# Patient Record
Sex: Female | Born: 1940 | Race: Black or African American | Hispanic: No
Health system: Southern US, Community
[De-identification: ages and names within clinical notes are randomized; demographics above are authoritative.]

---

## 2014-01-20 DIAGNOSIS — E785 Hyperlipidemia, unspecified: Secondary | ICD-10-CM | POA: Insufficient documentation

## 2014-01-26 DIAGNOSIS — J449 Chronic obstructive pulmonary disease, unspecified: Secondary | ICD-10-CM | POA: Insufficient documentation

## 2019-07-22 ENCOUNTER — Emergency Department (HOSPITAL_COMMUNITY): Payer: Medicare (Managed Care)

## 2019-07-22 ENCOUNTER — Observation Stay (HOSPITAL_COMMUNITY)
Admission: EM | Admit: 2019-07-22 | Discharge: 2019-07-23 | Disposition: A | Discharge status: Home / Self Care | Payer: Medicare (Managed Care) | Attending: Student in an Organized Health Care Education/Training Program | Admitting: Student in an Organized Health Care Education/Training Program

## 2019-07-22 DIAGNOSIS — Z79899 Other long term (current) drug therapy: Secondary | ICD-10-CM | POA: Diagnosis not present

## 2019-07-22 DIAGNOSIS — Z7982 Long term (current) use of aspirin: Secondary | ICD-10-CM | POA: Diagnosis not present

## 2019-07-22 DIAGNOSIS — I69354 Hemiplegia and hemiparesis following cerebral infarction affecting left non-dominant side: Secondary | ICD-10-CM | POA: Insufficient documentation

## 2019-07-22 DIAGNOSIS — I4891 Unspecified atrial fibrillation: Secondary | ICD-10-CM | POA: Diagnosis not present

## 2019-07-22 DIAGNOSIS — R55 Syncope and collapse: Secondary | ICD-10-CM | POA: Diagnosis present

## 2019-07-22 DIAGNOSIS — D649 Anemia, unspecified: Secondary | ICD-10-CM | POA: Insufficient documentation

## 2019-07-22 DIAGNOSIS — N183 Chronic kidney disease, stage 3 unspecified: Secondary | ICD-10-CM | POA: Diagnosis present

## 2019-07-22 DIAGNOSIS — Z7901 Long term (current) use of anticoagulants: Secondary | ICD-10-CM | POA: Diagnosis not present

## 2019-07-22 DIAGNOSIS — Z20828 Contact with and (suspected) exposure to other viral communicable diseases: Secondary | ICD-10-CM | POA: Diagnosis not present

## 2019-07-22 DIAGNOSIS — E785 Hyperlipidemia, unspecified: Secondary | ICD-10-CM | POA: Insufficient documentation

## 2019-07-22 DIAGNOSIS — K219 Gastro-esophageal reflux disease without esophagitis: Secondary | ICD-10-CM | POA: Diagnosis not present

## 2019-07-22 DIAGNOSIS — F17210 Nicotine dependence, cigarettes, uncomplicated: Secondary | ICD-10-CM | POA: Diagnosis not present

## 2019-07-22 DIAGNOSIS — I259 Chronic ischemic heart disease, unspecified: Secondary | ICD-10-CM | POA: Diagnosis not present

## 2019-07-22 DIAGNOSIS — I130 Hypertensive heart and chronic kidney disease with heart failure and stage 1 through stage 4 chronic kidney disease, or unspecified chronic kidney disease: Secondary | ICD-10-CM | POA: Diagnosis not present

## 2019-07-22 DIAGNOSIS — I5022 Chronic systolic (congestive) heart failure: Secondary | ICD-10-CM | POA: Diagnosis not present

## 2019-07-22 DIAGNOSIS — I959 Hypotension, unspecified: Principal | ICD-10-CM | POA: Diagnosis present

## 2019-07-22 DIAGNOSIS — N179 Acute kidney failure, unspecified: Secondary | ICD-10-CM | POA: Diagnosis not present

## 2019-07-22 DIAGNOSIS — F0390 Unspecified dementia without behavioral disturbance: Secondary | ICD-10-CM | POA: Insufficient documentation

## 2019-07-22 LAB — CBC WITH DIFFERENTIAL/PLATELET
Abs Immature Granulocytes: 0.01 10*3/uL (ref 0.00–0.07)
Basophils Absolute: 0.1 10*3/uL (ref 0.0–0.1)
Basophils Relative: 1 %
Eosinophils Absolute: 0.1 10*3/uL (ref 0.0–0.5)
Eosinophils Relative: 2 %
HCT: 32.2 % — ABNORMAL LOW (ref 36.0–46.0)
Hemoglobin: 10.3 g/dL — ABNORMAL LOW (ref 12.0–15.0)
Immature Granulocytes: 0 %
Lymphocytes Relative: 46 %
Lymphs Abs: 2.7 10*3/uL (ref 0.7–4.0)
MCH: 28.1 pg (ref 26.0–34.0)
MCHC: 32 g/dL (ref 30.0–36.0)
MCV: 87.7 fL (ref 80.0–100.0)
Monocytes Absolute: 0.5 10*3/uL (ref 0.1–1.0)
Monocytes Relative: 9 %
Neutro Abs: 2.4 10*3/uL (ref 1.7–7.7)
Neutrophils Relative %: 42 %
Platelets: 243 10*3/uL (ref 150–400)
RBC: 3.67 MIL/uL — ABNORMAL LOW (ref 3.87–5.11)
RDW: 14.2 % (ref 11.5–15.5)
WBC: 5.8 10*3/uL (ref 4.0–10.5)
nRBC: 0 % (ref 0.0–0.2)

## 2019-07-22 LAB — COMPREHENSIVE METABOLIC PANEL
ALT: 11 U/L (ref 0–44)
AST: 16 U/L (ref 15–41)
Albumin: 3.6 g/dL (ref 3.5–5.0)
Alkaline Phosphatase: 89 U/L (ref 38–126)
Anion gap: 14 (ref 5–15)
BUN: 32 mg/dL — ABNORMAL HIGH (ref 8–23)
CO2: 27 mmol/L (ref 22–32)
Calcium: 9.4 mg/dL (ref 8.9–10.3)
Chloride: 103 mmol/L (ref 98–111)
Creatinine, Ser: 2.22 mg/dL — ABNORMAL HIGH (ref 0.44–1.00)
GFR calc Af Amer: 24 mL/min — ABNORMAL LOW (ref >60)
GFR calc non Af Amer: 21 mL/min — ABNORMAL LOW (ref >60)
Glucose, Bld: 115 mg/dL — ABNORMAL HIGH (ref 70–99)
Potassium: 3.8 mmol/L (ref 3.5–5.1)
Sodium: 144 mmol/L (ref 135–145)
Total Bilirubin: 0.4 mg/dL (ref 0.3–1.2)
Total Protein: 6.6 g/dL (ref 6.5–8.1)

## 2019-07-22 LAB — TYPE AND SCREEN
ABO/RH(D): AB POS
Antibody Screen: NEGATIVE

## 2019-07-22 LAB — PROTIME-INR
INR: 1.3 — ABNORMAL HIGH (ref 0.8–1.2)
Prothrombin Time: 15.9 seconds — ABNORMAL HIGH (ref 11.4–15.2)

## 2019-07-22 LAB — ABO/RH: ABO/RH(D): AB POS

## 2019-07-22 LAB — LIPASE, BLOOD: Lipase: 57 U/L — ABNORMAL HIGH (ref 11–51)

## 2019-07-22 LAB — LACTIC ACID, PLASMA
Lactic Acid, Venous: 2.1 mmol/L (ref 0.5–1.9)
Lactic Acid, Venous: 1.6 mmol/L (ref 0.5–1.9)

## 2019-07-22 LAB — BRAIN NATRIURETIC PEPTIDE: B Natriuretic Peptide: 142.9 pg/mL — ABNORMAL HIGH (ref 0.0–100.0)

## 2019-07-22 LAB — TSH: TSH: 0.534 u[IU]/mL (ref 0.350–4.500)

## 2019-07-22 LAB — MAGNESIUM: Magnesium: 1.8 mg/dL (ref 1.7–2.4)

## 2019-07-22 LAB — PHOSPHORUS: Phosphorus: 3.6 mg/dL (ref 2.5–4.6)

## 2019-07-22 LAB — TROPONIN I (HIGH SENSITIVITY)
Troponin I (High Sensitivity): 21 ng/L — ABNORMAL HIGH (ref <18)
Troponin I (High Sensitivity): 22 ng/L — ABNORMAL HIGH (ref <18)

## 2019-07-22 LAB — CBG MONITORING, ED: Glucose-Capillary: 116 mg/dL — ABNORMAL HIGH (ref 70–99)

## 2019-07-22 LAB — POC OCCULT BLOOD, ED: Fecal Occult Bld: NEGATIVE

## 2019-07-22 MED ORDER — ACETAMINOPHEN 325 MG PO TABS: 650.0000 mg | ORAL_TABLET | Freq: Four times a day (QID) | ORAL | Status: DC | PRN

## 2019-07-22 MED ORDER — DOCUSATE SODIUM 100 MG PO CAPS: 200.0000 mg | ORAL_CAPSULE | Freq: Every day | ORAL | Status: DC | PRN

## 2019-07-22 MED ORDER — SERTRALINE HCL 50 MG PO TABS
25.0000 mg | ORAL_TABLET | Freq: Every day | ORAL | Status: DC
  Administered 2019-07-23: 25 mg via ORAL
  Filled 2019-07-22: qty 1

## 2019-07-22 MED ORDER — SODIUM CHLORIDE 0.9 % IV BOLUS
500.0000 mL | Freq: Once | INTRAVENOUS | Status: AC
  Administered 2019-07-22: 500 mL via INTRAVENOUS

## 2019-07-22 MED ORDER — ACETAMINOPHEN 650 MG RE SUPP: 650.0000 mg | Freq: Four times a day (QID) | RECTAL | Status: DC | PRN

## 2019-07-22 MED ORDER — APIXABAN 5 MG PO TABS
5.0000 mg | ORAL_TABLET | Freq: Two times a day (BID) | ORAL | Status: DC
  Administered 2019-07-23 (×2): 5 mg via ORAL
  Filled 2019-07-22 (×2): qty 1

## 2019-07-22 MED ORDER — MAGNESIUM OXIDE 400 (241.3 MG) MG PO TABS
400.0000 mg | ORAL_TABLET | Freq: Every day | ORAL | Status: DC
  Administered 2019-07-23: 400 mg via ORAL
  Filled 2019-07-22: qty 1

## 2019-07-22 MED ORDER — ATORVASTATIN CALCIUM 80 MG PO TABS
80.0000 mg | ORAL_TABLET | Freq: Every day | ORAL | Status: DC
  Administered 2019-07-23: 80 mg via ORAL
  Filled 2019-07-22: qty 1

## 2019-07-22 MED ORDER — SODIUM CHLORIDE 0.9% FLUSH
3.0000 mL | Freq: Two times a day (BID) | INTRAVENOUS | Status: DC
  Administered 2019-07-23: 3 mL via INTRAVENOUS

## 2019-07-22 MED ORDER — PANTOPRAZOLE SODIUM 40 MG PO TBEC
40.0000 mg | DELAYED_RELEASE_TABLET | Freq: Every day | ORAL | Status: DC
  Administered 2019-07-23: 40 mg via ORAL
  Filled 2019-07-22: qty 1

## 2019-07-22 MED ORDER — FERROUS SULFATE 325 (65 FE) MG PO TABS
325.0000 mg | ORAL_TABLET | Freq: Every day | ORAL | Status: DC
  Administered 2019-07-23: 325 mg via ORAL
  Filled 2019-07-22: qty 1

## 2019-07-22 NOTE — ED Triage Notes (Signed)
Pt presents from home, was resting in a chair at kitchen table when famnily noticed pt tilt to the side and fall out of chair, hitting her head. Unresponsive per family after this event. Per EMS, Alert and oriented x2 (person, month).  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 Family was poor historians of baseline cognition and deficits, said the pt had been feeling poorly for a few days.  EMS exam - palp SBP 70 upon arrival, improved to 80/50 en route. CBG 122, 94% RA

## 2019-07-22 NOTE — ED Provider Notes (Signed)
[facility] EMERGENCY DEPARTMENT Provider Note   CSN: [id number] Arrival date & time: 07/22/19  1834     History   Chief Complaint Chief Complaint  Patient presents with   Fall    HPI Deborah Newton is a 78 y.o. female.     HPI Patient is from [place].  She is visiting with family over the holiday.  She has history of mild dementia but is active and verbal.  She also has history of a left-sided stroke with some residual weakness deficit but again, alert and interactive at baseline.  Ambulatory with intact motor function.  Patient has history of hypertension.  Her niece is here with her for additional history.  She reports that she and a couple of other family members were seated around the kitchen.  Patient apparently became unresponsive and started slumping over in the chair and then fell out on her left side.  They were unable to catch her before she hit the floor.  Her niece reports that the patient's eyes were rolled back in her head.  They were trying to arouse her.  She was very pale in appearance.  It took several minutes for her to start coming back around.  On EMS arrival, the patient had systolic blood pressure of 70.  On route blood pressure improved to 80/50.  CBG was 122 and 94% room air oxygen.  The patient denies feeling poorly.  She does not recall the event occurring or how she felt just prior to it.  She denies that she has any headache, chest pain or any perception of new weakness numbness or tingling.  She denies any loss of her vision.  She reports she is already starting to feel better.  The patient's niece reports for several days however the patient has not been eating much.  She has been eating a small portion of food off and on during the day.  Patient has continued to take her regular antihypertensive medications.  Patient denies she has been having problems with cough, fever chills or shortness of breath.  Patient does smoke  occasionally. No past medical history on file.  There are no active problems to display for this patient.      OB History   No obstetric history on file.      Home Medications    Prior to Admission medications   Medication Sig Start Date End Date Taking? Authorizing Provider  ASPIRIN ADULT LOW STRENGTH 81 MG EC tablet Take 81 mg by mouth daily. 05/27/19   [provider]  atorvastatin (LIPITOR) 80 MG tablet Take 80 mg by mouth at bedtime. 05/27/19   [provider]  bumetanide (BUMEX) 2 MG tablet Take 2 mg by mouth daily. 05/19/19   [provider]  docusate sodium (COLACE) 100 MG capsule Take 200 mg by mouth daily as needed. 05/27/19   [provider]  ELIQUIS 5 MG TABS tablet Take 5 mg by mouth 2 (two) times daily. 05/27/19   [provider]  ferrous sulfate 325 (65 FE) MG EC tablet Take 325 mg by mouth daily. 05/19/19   [provider]  hydrALAZINE (APRESOLINE) 50 MG tablet Take 50 mg by mouth daily. 05/27/19   [provider]  isosorbide mononitrate (IMDUR) 30 MG 24 hr tablet Take 30 mg by mouth every morning. 05/19/19   [provider]  lisinopril (ZESTRIL) 10 MG tablet Take 10 mg by mouth daily. 05/07/19   [provider]  magnesium oxide (  MAG-OX) 400 MG tablet Take 400 mg by mouth daily. 02/09/19   [provider]  metoprolol succinate (TOPROL-XL) 100 MG 24 hr tablet Take 100 mg by mouth daily. 05/19/19   [provider]  pantoprazole (PROTONIX) 40 MG tablet Take 40 mg by mouth daily. 06/23/19   [provider]  sertraline (ZOLOFT) 25 MG tablet Take 25 mg by mouth daily. 05/27/19   [provider]    Family History No family history on file.  Social History Social History   Tobacco Use   Smoking status: Not on file  Substance Use Topics   Alcohol use: Not on file   Drug use: Not on file     Allergies   Patient has no allergy information on  record.   Review of Systems Review of Systems 10 Systems reviewed and are negative for acute change except as noted in the HPI.   Physical Exam Updated Vital Signs BP (!) 98/57    Pulse (!) 52    Temp 97.8 F (36.6 C) (Oral)    Resp 20    SpO2 98%   Physical Exam Constitutional:      Comments: Patient appears slightly slow to respond but is answering my questions.  She is not expressing any respiratory distress.  She is following commands.  She has pale appearance but skin is dry.  HENT:     Head: Normocephalic and atraumatic.     Comments: No palpable hematomas    Nose: Nose normal.     Mouth/Throat:     Mouth: Mucous membranes are moist.     Pharynx: Oropharynx is clear.  Eyes:     Extraocular Movements: Extraocular movements intact.     Conjunctiva/sclera: Conjunctivae normal.     Pupils: Pupils are equal, round, and reactive to light.     Comments: Full extraocular motions intact.  No nystagmus.  Neck:     Musculoskeletal: Neck supple.     Comments: Patient maintained in cervical collar until C-spine films completed.  Subsequently removed.  Neck is supple. Cardiovascular:     Comments: Borderline bradycardia.  No gross rub murmur gallop. Pulmonary:     Effort: Pulmonary effort is normal.     Breath sounds: Normal breath sounds.  Abdominal:     General: There is no distension.     Palpations: Abdomen is soft.     Tenderness: There is no abdominal tenderness.  Genitourinary:    Comments: Stool is brown.  No melena. Musculoskeletal: Normal range of motion.        General: No swelling or tenderness.     Right lower leg: No edema.     Left lower leg: No edema.     Comments: No apparent extremity injuries.  No deformities.  No pain with range of motion.  No contusions or abrasions to the back or shoulders.  Neurological:     Comments: Patient was slightly delayed on arrival.  She however continued to improve.  Her initial exam on arrival did not reveal any focal motor  deficits.  She was able to grip both hands tightly at command.  She followed appropriately commands for gripping and releasing.  Also follow commands for elevating each leg independently off the bed.  She follows commands for cranial nerve testing.  Patient was able to recite her name and that she was from [place] and staying with family members.  Psychiatric:        Mood and Affect: Mood normal.  ED Treatments / Results  Labs (all labs ordered are listed, but only abnormal results are displayed) Labs Reviewed  COMPREHENSIVE METABOLIC PANEL - Abnormal; Notable for the following components:      Result Value   Glucose, Bld 115 (*)    BUN 32 (*)    Creatinine, Ser 2.22 (*)    GFR calc non Af Amer 21 (*)    GFR calc Af Amer 24 (*)    All other components within normal limits  LIPASE, BLOOD - Abnormal; Notable for the following components:   Lipase 57 (*)    All other components within normal limits  BRAIN NATRIURETIC PEPTIDE - Abnormal; Notable for the following components:   B Natriuretic Peptide 142.9 (*)    All other components within normal limits  LACTIC ACID, PLASMA - Abnormal; Notable for the following components:   Lactic Acid, Venous 2.1 (*)    All other components within normal limits  CBC WITH DIFFERENTIAL/PLATELET - Abnormal; Notable for the following components:   RBC 3.67 (*)    Hemoglobin 10.3 (*)    HCT 32.2 (*)    All other components within normal limits  PROTIME-INR - Abnormal; Notable for the following components:   Prothrombin Time 15.9 (*)    INR 1.3 (*)    All other components within normal limits  CBG MONITORING, ED - Abnormal; Notable for the following components:   Glucose-Capillary 116 (*)    All other components within normal limits  TROPONIN I (HIGH SENSITIVITY) - Abnormal; Notable for the following components:   Troponin I (High Sensitivity) 21 (*)    All other components within normal limits  TROPONIN I (HIGH SENSITIVITY) - Abnormal;  Notable for the following components:   Troponin I (High Sensitivity) 22 (*)    All other components within normal limits  CULTURE, BLOOD (ROUTINE X 2)  CULTURE, BLOOD (ROUTINE X 2)  SARS CORONAVIRUS 2 (TAT 6-24 HRS)  LACTIC ACID, PLASMA  TSH  MAGNESIUM  PHOSPHORUS  URINALYSIS, ROUTINE W REFLEX MICROSCOPIC  I-STAT CHEM 8, ED  POC OCCULT BLOOD, ED  TYPE AND SCREEN  ABO/RH    EKG EKG Interpretation  Date/Time:  Friday July 22 2019 18:47:42 EST Ventricular Rate:  51 PR Interval:    QRS Duration: 94 QT Interval:  476 QTC Calculation: 439 R Axis:   -2 Text Interpretation: Age not entered, assumed to be  78 years old for purpose of ECG interpretation Sinus rhythm Abnormal R-wave progression, early transition Nonspecific T abnormalities, diffuse leads no STEMI no old comp. lateral T wave inversion. no old comparison Confirmed by Arby BarrettePfeiffer, Eliah Ozawa [phone]([id number]) on 07/22/2019 7:09:45 PM   Radiology Ct Head Wo Contrast  Result Date: 07/22/2019 CLINICAL DATA:  Pain after fall EXAM: CT HEAD WITHOUT CONTRAST CT CERVICAL SPINE WITHOUT CONTRAST TECHNIQUE: Multidetector CT imaging of the head and cervical spine was performed following the standard protocol without intravenous contrast. Multiplanar CT image reconstructions of the cervical spine were also generated. COMPARISON:  None. FINDINGS: CT HEAD FINDINGS Brain: Infarcts with associated encephalomalacia are seen in the posterior right frontal lobe, the right parietal lobe, right temporal lobe, the left cerebellar hemisphere, the left occipital lobe, and the left posterior parietal lobe. No acute ischemia or infarct is identified. No subdural, epidural, or subarachnoid hemorrhage identified. Basal cisterns are patent. Ventricles are unremarkable. No mass effect or midline shift. Vascular: Calcified atherosclerosis is identified in the intracranial carotids. Skull: Normal. Negative for fracture or focal lesion. Sinuses/Orbits: Paranasal sinuses and  left mastoid air cells are normal. Middle ears are normal. Fluid in inferior right mastoid air cells without erosion or overlying soft tissue swelling is identified. Other: None. CT CERVICAL SPINE FINDINGS Alignment: Normal. Skull base and vertebrae: No acute fracture. No primary bone lesion or focal pathologic process. Soft tissues and spinal canal: Calcified atherosclerosis in the carotid arteries. Suspected right thyroid in lobe nodule measuring up to 2 cm on axial image 9. Atherosclerosis in the aortic arch. Disc levels:  Multilevel degenerative changes. Upper chest: Negative. Other: No other abnormalities. IMPRESSION: 1. No acute intracranial abnormalities. Multiple infarcts are chronic in appearance with encephalomalacia. 2. No fracture or traumatic malalignment in the cervical spine. Significant degenerative changes. 3. 2 cm nodule in the right thyroid lobe. If the patient has significant comorbidities or limited life expectancy, no follow-up is necessary. Otherwise, recommend ultrasound. Electronically Signed   By: Gerome Sam III M.D   On: 07/22/2019 20:07   Ct Cervical Spine Wo Contrast  Result Date: 07/22/2019 CLINICAL DATA:  Pain after fall EXAM: CT HEAD WITHOUT CONTRAST CT CERVICAL SPINE WITHOUT CONTRAST TECHNIQUE: Multidetector CT imaging of the head and cervical spine was performed following the standard protocol without intravenous contrast. Multiplanar CT image reconstructions of the cervical spine were also generated. COMPARISON:  None. FINDINGS: CT HEAD FINDINGS Brain: Infarcts with associated encephalomalacia are seen in the posterior right frontal lobe, the right parietal lobe, right temporal lobe, the left cerebellar hemisphere, the left occipital lobe, and the left posterior parietal lobe. No acute ischemia or infarct is identified. No subdural, epidural, or subarachnoid hemorrhage identified. Basal cisterns are patent. Ventricles are unremarkable. No mass effect or midline shift.  Vascular: Calcified atherosclerosis is identified in the intracranial carotids. Skull: Normal. Negative for fracture or focal lesion. Sinuses/Orbits: Paranasal sinuses and left mastoid air cells are normal. Middle ears are normal. Fluid in inferior right mastoid air cells without erosion or overlying soft tissue swelling is identified. Other: None. CT CERVICAL SPINE FINDINGS Alignment: Normal. Skull base and vertebrae: No acute fracture. No primary bone lesion or focal pathologic process. Soft tissues and spinal canal: Calcified atherosclerosis in the carotid arteries. Suspected right thyroid in lobe nodule measuring up to 2 cm on axial image 9. Atherosclerosis in the aortic arch. Disc levels:  Multilevel degenerative changes. Upper chest: Negative. Other: No other abnormalities. IMPRESSION: 1. No acute intracranial abnormalities. Multiple infarcts are chronic in appearance with encephalomalacia. 2. No fracture or traumatic malalignment in the cervical spine. Significant degenerative changes. 3. 2 cm nodule in the right thyroid lobe. If the patient has significant comorbidities or limited life expectancy, no follow-up is necessary. Otherwise, recommend ultrasound. Electronically Signed   By: Gerome Sam III M.D   On: 07/22/2019 20:07   Dg Chest Port 1 View  Result Date: 07/22/2019 CLINICAL DATA:  Syncope EXAM: PORTABLE CHEST 1 VIEW COMPARISON:  None. FINDINGS: Streaky right basilar and retrocardiac opacity. No focal consolidation. No pneumothorax or effusion. Mild cardiomegaly with a calcified aorta. Increased attenuation in the lower mediastinum could reflect a small hiatal hernia. Degenerative changes are present in the imaged spine and shoulders. No acute osseous or soft tissue abnormality. IMPRESSION: 1. Streaky right basilar and retrocardiac opacity, likely atelectasis. 2. Increased attenuation in the lower mediastinum could reflect a small hiatal hernia. 3. Mild cardiomegaly 4.  Aortic Atherosclerosis  (ICD10-I70.0). Electronically Signed   By: Kreg Shropshire M.D.   On: 07/22/2019 19:34    Procedures Procedures (including critical care time) CRITICAL CARE Performed by:  Katalea Ucci   Total critical care time: 45  minutes  Critical care time was exclusive of separately billable procedures and treating other patients.  Critical care was necessary to treat or prevent imminent or life-threatening deterioration.  Critical care was time spent personally by me on the following activities: development of treatment plan with patient and/or surrogate as well as nursing, discussions with consultants, evaluation of patient's response to treatment, examination of patient, obtaining history from patient or surrogate, ordering and performing treatments and interventions, ordering and review of laboratory studies, ordering and review of radiographic studies, pulse oximetry and re-evaluation of patient's condition. Medications Ordered in ED Medications  atorvastatin (LIPITOR) tablet 80 mg (has no administration in time range)  sertraline (ZOLOFT) tablet 25 mg (has no administration in time range)  docusate sodium (COLACE) capsule 200 mg (has no administration in time range)  apixaban (ELIQUIS) tablet 5 mg (has no administration in time range)  pantoprazole (PROTONIX) EC tablet 40 mg (has no administration in time range)  magnesium oxide (MAG-OX) tablet 400 mg (has no administration in time range)  ferrous sulfate EC tablet 325 mg (has no administration in time range)  sodium chloride 0.9 % bolus 500 mL (0 mLs Intravenous Stopped 07/22/19 2102)  sodium chloride 0.9 % bolus 500 mL (0 mLs Intravenous Stopped 07/22/19 2205)     Initial Impression / Assessment and Plan / ED Course  I have reviewed the triage vital signs and the nursing notes.  Pertinent labs & imaging results that were available during my care of the patient were reviewed by me and considered in my medical decision making (see chart for  details).  Clinical Course as of Jul 22 2243  Fri Jul 22, 2019  2238 Consult: Reviewed with Dr. Alinda Money for internal medicine teaching service.   [MP]    Clinical Course User Index [MP] Arby Barrette, MD      Patient does have history of stroke and dementia but is high functioning.  She has shown improvement since being in the emergency department with hydration.  Unfortunately, there are no currently available lab values to assess for acute renal insufficiency versus chronic kidney injury.  By history, patient has had decreased p.o. intake for several days.  This in conjunction with continued compliance with antihypertensives, may have precipitated hypovolemia.  At this time patient does not seem to have acute infectious etiology.  Her mildly elevated lactate has resolved with hydration.  ROS negative for fevers, cough, chills, vomiting or diarrhea.  Currently, no source of infection for empiric antibiotics.  Patient's troponin is flat at 20 and 21.  There is no available old EKG for diffuse T wave inversions.  However, patient has not had any chest pain and no troponin elevation.  At this time we will plan for admission for continued monitoring and trending of labs but she is showing improvement with hydration.   Final Clinical Impressions(s) / ED Diagnoses   Final diagnoses:  Syncope and collapse  Hypotension, unspecified hypotension type    ED Discharge Orders    None       Arby Barrette, MD 07/22/19 2246

## 2019-07-22 NOTE — ED Notes (Signed)
Pharmacy tech called per nursing communication, pharm tech states he will come to room to verify meds with pt and daughter shortly

## 2019-07-22 NOTE — ED Notes (Signed)
[place] sandwich and juice given. OK per MD

## 2019-07-22 NOTE — H&P (Addendum)
Date: 07/22/2019               Patient Name:  Deborah Newton MRN: [id number]  DOB: May 04, 1941 Age / Sex: 78 y.o., female   PCP: Patient, No Pcp Per         Medical Service: Internal Medicine Teaching Service         Attending Physician: Dr. Oswaldo DoneVincent, Marquita Palmsuncan Thomas, *    First Contact: Dr. Barbaraann FasterSteen Pager: [phone]  Second Contact: Dr. Chesley MiresBloomfield  Pager: [phone]       After Hours (After 5p/  First Contact Pager: [phone]  weekends / holidays): Second Contact Pager: [phone]   Chief Complaint: Syncope  History of Present Illness: [name] is a 78 year old female with a past medical history significant for dementia, CVA with residual left-sided weakness, a-fib, HFrEF (EF 40-45% 04/2014), and HTN who presented to the ED with AMS. Patient is from [place] but is in town to visit family for the holiday. Earlier today, the patient was seated with some family members in the kitchen. She became unresponsive, started to slump in her chair, and then fell on the floor on her left side. Family members reportedly saw her eyes rolled to the back of her head. She did not have body convulsions, biting of the lip, or bowel and bladder incontinence. She lost consciousness prior to tipping over, and was unconscious for approximately 3 minutes. When she woke up, she was very confused and not responding to questions for about 15 minutes then returned to baseline. No new focal weaknesses. When EMS arrived, the patient had a systolic blood pressure of 70. Per the patient's daughter, the patient has not been eating or drinking well over the past several days. She has only been eating small portions of food on and off for the last few days.  In the ED, a CBC, CMP, lipase, BNP, troponin, magnesium, phosphorus, TSH, lactate, PT/INR, and blood cultures were obtained. Significant findings include elevated BUN and creatinine of 32 and 2.22 respectively.  BNP was slightly elevated to 142.9. PT and INR were elevated to  15.9 and 1.3 respectively.  Lactate was elevated to 2.1 but decreased to 1.6.  Troponins unremarkable. after administration of 1 L of IV fluids.  CT of the head did not show acute intracranial abnormalities.  CT of the cervical spine significant for multilevel degenerative disc changes an incidental 2 cm nodule in the right thyroid lobe.  Meds:  - Aspirin 81 mg daily - Atorvastatin 80 mg daily - Imdur 30 mg daily - Lisinopril 10 mg daily - Hydralazine 50 mg daily - Metoprolol 100 mg daily - Eliquis 5 mg twice daily - Zoloft 25 mg daily - Protonix 40 mg daily - Magnesium oxide 400 mg daily - Colace 200 mg daily as needed  Allergies: Allergies as of 07/22/2019   (Not on File)   No past medical history on file.  Family History:  - HTN - T2DM - Leukemia, colon and lung cancer  Social History: - Lives in [place], here visiting family - High functioning, no severe deficits outside some mild weakness on the L side. Ambulates without assistance.  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 Occasional marijuana use  Review of Systems: A complete ROS was negative except as per HPI.   Imaging: CT Head & Cervical Spine: IMPRESSION: 1. No acute intracranial abnormalities. Multiple infarcts are chronic in appearance with encephalomalacia. 2. No fracture or traumatic malalignment in the  cervical spine. Significant degenerative changes. 3. 2 cm nodule in the right thyroid lobe. If the patient has significant comorbidities or limited life expectancy, no follow-up is necessary. Otherwise, recommend ultrasound.  EKG: Atrial fibrillation Ventricular tachycardia, unsustained Aberrant complex Borderline repolarization abnormality Minimal ST elevation, inferior leads  CXR: IMPRESSION: 1. Streaky right basilar and retrocardiac opacity, likely atelectasis. 2. Increased attenuation in the lower mediastinum could reflect a small hiatal hernia. 3. Mild  cardiomegaly 4.  Aortic Atherosclerosis  Physical Exam: Blood pressure (!) 98/57, pulse (!) 52, temperature 97.8 F (36.6 C), temperature source Oral, resp. rate 20, SpO2 98 %.  Physical Exam Vitals signs reviewed.  Constitutional:      General: She is not in acute distress.    Appearance: She is obese. She is not ill-appearing or toxic-appearing.  HENT:     Head: Normocephalic and atraumatic.  Eyes:     General:        Right eye: No discharge.        Left eye: No discharge.     Extraocular Movements: Extraocular movements intact.  Cardiovascular:     Rate and Rhythm: Normal rate and regular rhythm.     Pulses: Normal pulses.     Heart sounds: Normal heart sounds. No murmur. No friction rub. No gallop.      Comments: No JVD or hepatojugular reflux Pulmonary:     Effort: Pulmonary effort is normal. No respiratory distress.     Breath sounds: Normal breath sounds. No wheezing or rales.     Comments: Mild crackles at bilateral bases Abdominal:     General: Bowel sounds are normal. There is no distension.     Palpations: Abdomen is soft.     Tenderness: There is no abdominal tenderness. There is no guarding.  Musculoskeletal:     Right lower leg: No edema (2+ up to thigh).     Left lower leg: No edema (2+ up to thigh).  Skin:    General: Skin is warm.  Neurological:     General: No focal deficit present.     Mental Status: She is alert.     Comments: Spontaneously moves all extremities antigravity   Psychiatric:        Mood and Affect: Mood normal.        Behavior: Behavior normal.    Assessment & Plan by Problem: Active Problems:   Syncope and collapse  In summary, Ms. Dung is a 78 year old female with past medical history significant for dementia, CVA with residual left-sided weakness, a-fib, HFrEF (EF 40-45% 04/2014), and HTN who presented to the ED after a syncopal event in her family's home. The patient is on several different antihypertensive medications and a  beta blocker and was found to have a systolic pressure of 70 upon EMS arrival, suggesting the cause of her syncope was related to hypotension.   #Syncope: Appears to be related to hypotension. Patient is on several different antihypertensive medications including beta-blockade and was found to have a systolic pressure of 70 upon EMS arrival, suggesting the cause of her syncope was related to hypotension. Arrhythmia is also possible. Seizure is less likely, given the fact the patient does not have a history of seizures and had no seizure-like activity. - Hold home antihypertensives - Echocardiogram ordered - Continuous cardiac monitoring - Follow-up blood cultures  #Hx of CAD s/p cath 2016 (no stents placed) #Hx CVA w/ L sided weakness #HTN #HLD: Patient takes Imdur, lisinopril, hydralazine, and metoprolol at home - Holding  home antihypertensives in the setting of hypotension - Atorvastatin 80 mg daily  #HFrEF (EF 40-45% in 04/2014): BNP mildly elevated to 142.9.  Appears euvolemic on exam. - Echocardiogram ordered  #AKI: Patient's BUN and creatinine was slightly elevated to 32 and 2.22 on admission. Last recorded creatinine was from 5 years ago in Care Everywhere and was normal. Patient received 2 L of IV fluids in the ED. - Daily BMPs  #A-fib - Eliquis 5 mg BID  #Anemia  - Ferrous sulfate 325 mg tablet daily  #GERD - Protonix 40 mg daily  #FEN/GI - Diet: Heart healthy - Fluids: None  #DVT prophylaxis - Eliquis as mentioned above  #CODE STATUS: DNI  #Dispo: Admit patient to Observation with expected length of stay less than 2 midnights.  Signed: Kirt Boys, MD Internal Medicine, PGY1 Pager: [phone]  07/22/2019,11:51 PM

## 2019-07-23 ENCOUNTER — Inpatient Hospital Stay (HOSPITAL_COMMUNITY): Payer: Medicare (Managed Care)

## 2019-07-23 ENCOUNTER — Other Ambulatory Visit: Payer: Self-pay

## 2019-07-23 DIAGNOSIS — Z79899 Other long term (current) drug therapy: Secondary | ICD-10-CM

## 2019-07-23 DIAGNOSIS — R55 Syncope and collapse: Secondary | ICD-10-CM

## 2019-07-23 DIAGNOSIS — I4891 Unspecified atrial fibrillation: Secondary | ICD-10-CM

## 2019-07-23 DIAGNOSIS — I5022 Chronic systolic (congestive) heart failure: Secondary | ICD-10-CM | POA: Diagnosis present

## 2019-07-23 DIAGNOSIS — I110 Hypertensive heart disease with heart failure: Secondary | ICD-10-CM | POA: Diagnosis not present

## 2019-07-23 DIAGNOSIS — I69354 Hemiplegia and hemiparesis following cerebral infarction affecting left non-dominant side: Secondary | ICD-10-CM

## 2019-07-23 DIAGNOSIS — Z9861 Coronary angioplasty status: Secondary | ICD-10-CM

## 2019-07-23 DIAGNOSIS — I2510 Atherosclerotic heart disease of native coronary artery without angina pectoris: Secondary | ICD-10-CM

## 2019-07-23 DIAGNOSIS — I959 Hypotension, unspecified: Secondary | ICD-10-CM | POA: Diagnosis present

## 2019-07-23 DIAGNOSIS — K219 Gastro-esophageal reflux disease without esophagitis: Secondary | ICD-10-CM

## 2019-07-23 DIAGNOSIS — Z7901 Long term (current) use of anticoagulants: Secondary | ICD-10-CM

## 2019-07-23 DIAGNOSIS — D649 Anemia, unspecified: Secondary | ICD-10-CM

## 2019-07-23 DIAGNOSIS — Z7982 Long term (current) use of aspirin: Secondary | ICD-10-CM

## 2019-07-23 DIAGNOSIS — Z720 Tobacco use: Secondary | ICD-10-CM

## 2019-07-23 DIAGNOSIS — N183 Chronic kidney disease, stage 3 unspecified: Secondary | ICD-10-CM | POA: Diagnosis present

## 2019-07-23 DIAGNOSIS — R7989 Other specified abnormal findings of blood chemistry: Secondary | ICD-10-CM

## 2019-07-23 DIAGNOSIS — F1290 Cannabis use, unspecified, uncomplicated: Secondary | ICD-10-CM

## 2019-07-23 DIAGNOSIS — N179 Acute kidney failure, unspecified: Secondary | ICD-10-CM

## 2019-07-23 LAB — URINALYSIS, ROUTINE W REFLEX MICROSCOPIC
Bilirubin Urine: NEGATIVE
Glucose, UA: NEGATIVE mg/dL
Hgb urine dipstick: NEGATIVE
Ketones, ur: NEGATIVE mg/dL
Leukocytes,Ua: NEGATIVE
Nitrite: NEGATIVE
Protein, ur: NEGATIVE mg/dL
Specific Gravity, Urine: 1.01 (ref 1.005–1.030)
pH: 5 (ref 5.0–8.0)

## 2019-07-23 LAB — CBC
HCT: 31.8 % — ABNORMAL LOW (ref 36.0–46.0)
Hemoglobin: 10.4 g/dL — ABNORMAL LOW (ref 12.0–15.0)
MCH: 28.3 pg (ref 26.0–34.0)
MCHC: 32.7 g/dL (ref 30.0–36.0)
MCV: 86.4 fL (ref 80.0–100.0)
Platelets: 221 10*3/uL (ref 150–400)
RBC: 3.68 MIL/uL — ABNORMAL LOW (ref 3.87–5.11)
RDW: 14.2 % (ref 11.5–15.5)
WBC: 7 10*3/uL (ref 4.0–10.5)
nRBC: 0 % (ref 0.0–0.2)

## 2019-07-23 LAB — SARS CORONAVIRUS 2 (TAT 6-24 HRS): SARS Coronavirus 2: NEGATIVE

## 2019-07-23 LAB — BASIC METABOLIC PANEL
Anion gap: 13 (ref 5–15)
BUN: 32 mg/dL — ABNORMAL HIGH (ref 8–23)
CO2: 26 mmol/L (ref 22–32)
Calcium: 9.1 mg/dL (ref 8.9–10.3)
Chloride: 105 mmol/L (ref 98–111)
Creatinine, Ser: 2.26 mg/dL — ABNORMAL HIGH (ref 0.44–1.00)
GFR calc Af Amer: 24 mL/min — ABNORMAL LOW (ref >60)
GFR calc non Af Amer: 20 mL/min — ABNORMAL LOW (ref >60)
Glucose, Bld: 144 mg/dL — ABNORMAL HIGH (ref 70–99)
Potassium: 3.7 mmol/L (ref 3.5–5.1)
Sodium: 144 mmol/L (ref 135–145)

## 2019-07-23 LAB — ECHOCARDIOGRAM COMPLETE
Height: 66 in
Weight: 2854.4 oz

## 2019-07-23 MED ORDER — METOPROLOL SUCCINATE ER 50 MG PO TB24: 50.0000 mg | ORAL_TABLET | Freq: Every day | ORAL | 0 refills | Status: AC

## 2019-07-23 NOTE — Progress Notes (Signed)
pt's daughter at [place] entrance. Pt was transported on wheelchair, by nurse tech with belongings bag.

## 2019-07-23 NOTE — Care Management (Signed)
Spoke w patient at bedside. She states that she is visiting from [place] and staying with her daughter Deborah Newton. She states her plans are to return home ASAP. She has [facility] services set up at home, this observation stay will not interfere with her current home health set up. No other CM needs are identified.

## 2019-07-23 NOTE — Progress Notes (Signed)
Discharge and medication information given with teach back. Questions and concerns answered.  [name] has left and let her know pt was discharge, she states that her sister Deborah Newton will pick pt up.  Nurse spoke with [name] regarding discharge, she stated that she will come to pick pt up at 4pm.   Pt was assisted to get dressed, peripheral iv removed, dressing applied. Pt's belongings in pts bag: clothes, depends, towel and shoes.

## 2019-07-23 NOTE — Evaluation (Signed)
Occupational Therapy Evaluation Patient Details Name: Deborah Newton MRN: [id number] DOB: [birth year] Today's Date: 07/23/2019    History of Present Illness This 78 y.o. female admitted after period of unresponsiveness while sitting at the table.   CT of head negative for acute infarct.  Dx: syncopal episode likely due to hypotension - work up ongoing.  PMH includes:  dementia, CVA with residual Lt sided weakness, A-Fib, heart failure.     Clinical Impression   Patient evaluated by Occupational Therapy with no further acute OT needs identified. All education has been completed and the patient has no further questions. Pt appears to be at, or close to baseline.  Spoke to daughter over the phone, and pt required assist with ADLs and supervision with ADLs.  Pt reports h/o falls and uses a SPC or a RW (recommend use of RW due to LOB while working with OT).  Daughter also reports pt was receiving HHOT and PT in [place], and would recommend she resume therapies once she returns home.  The plan is that she will remain at family member's home in [place] x 24 hours after discharge, and then they will return home to [place].   See below for any follow-up Occupational Therapy or equipment needs. OT is signing off. Thank you for this referral.  BP supine 119/70, HR 49; seated 107/60, HR 50; standing 113/86, HR 57; end of session 105/81, HR 57      Follow Up Recommendations  Home health OT;Supervision/Assistance - 24 hour    Equipment Recommendations  None recommended by OT    Recommendations for Other Services       Precautions / Restrictions Precautions Precautions: Fall Precaution Comments: Pt endorses h/o falls and reports her ankle turns.  She was receiving HHOT and PT in [place]  Restrictions Weight Bearing Restrictions: No      Mobility Bed Mobility Overal bed mobility: Modified Independent                Transfers Overall transfer level: Needs assistance   Transfers: Sit to/from  Stand;Stand Pivot Transfers Sit to Stand: Supervision Stand pivot transfers: Supervision       General transfer comment: supervision  for safety     Balance Overall balance assessment: Needs assistance Sitting-balance support: Feet supported Sitting balance-Leahy Scale: Good     Standing balance support: Single extremity supported Standing balance-Leahy Scale: Poor Standing balance comment: maintains UE support                            ADL either performed or assessed with clinical judgement   ADL Overall ADL's : Needs assistance/impaired Eating/Feeding: Independent   Grooming: Wash/dry hands;Wash/dry face;Oral care;Brushing hair;Min guard;Standing   Upper Body Bathing: Set up;Supervision/ safety;Sitting   Lower Body Bathing: Minimal assistance;Sit to/from stand   Upper Body Dressing : Minimal assistance;Sitting   Lower Body Dressing: Moderate assistance;Sit to/from stand   Toilet Transfer: Min guard;Ambulation;Comfort height toilet;Grab bars   Toileting- Clothing Manipulation and Hygiene: Min guard;Sit to/from stand       Functional mobility during ADLs: Min guard;Cane       Vision Patient Visual Report: No change from baseline       Perception     Praxis      Pertinent Vitals/Pain Pain Assessment: No/denies pain     Hand Dominance Right   Extremity/Trunk Assessment Upper Extremity Assessment Upper Extremity Assessment: Generalized weakness(was receiving HHOT "for her shoulders" )   Lower Extremity  Assessment Lower Extremity Assessment: Defer to PT evaluation   Cervical / Trunk Assessment Cervical / Trunk Assessment: Normal   Communication Communication Communication: No difficulties   Cognition Arousal/Alertness: Awake/alert Behavior During Therapy: WFL for tasks assessed/performed Overall Cognitive Status: History of cognitive impairments - at baseline                                 General Comments: per  daughter's report (via phone, pt appears to be at baseline    General Comments  BP supine 119/70, HR 49; seated 107/60, HR 50; standing 113/86, HR 57; end of session 105/81, HR 57    Exercises     Shoulder Instructions      Home Living Family/patient expects to be discharged to:: Private residence Living Arrangements: Other relatives Available Help at Discharge: Family;Available 24 hours/day Type of Home: House Home Access: Stairs to enter [employer] of Steps: 3   Home Layout: One level     Bathroom Shower/Tub: Tub/shower unit         Home Equipment: Environmental consultant - 2 wheels;Cane - single point   Additional Comments: The above info is for her daughter's home here in [place].  At her home in [place], she has a 2 story house with stair lift.        Prior Functioning/Environment Level of Independence: Needs assistance  Gait / Transfers Assistance Needed: Pt ambulates with RW or SPC depending on the day/time  ADL's / Homemaking Assistance Needed: daughter reports she assists pt with bathing and dressing             OT Problem List: Decreased activity tolerance;Decreased cognition;Impaired balance (sitting and/or standing)      OT Treatment/Interventions:      OT Goals(Current goals can be found in the care plan section) Acute Rehab OT Goals Patient Stated Goal: to go back to [place]  OT Goal Formulation: All assessment and education complete, DC therapy  OT Frequency:     Barriers to D/C:            Co-evaluation              AM-PAC OT "6 Clicks" Daily Activity     Outcome Measure Help from another person eating meals?: None Help from another person taking care of personal grooming?: A Little Help from another person toileting, which includes using toliet, bedpan, or urinal?: A Little Help from another person bathing (including washing, rinsing, drying)?: A Lot Help from another person to put on and taking off regular upper body clothing?: A Little Help from  another person to put on and taking off regular lower body clothing?: A Lot 6 Click Score: 17   End of Session Equipment Utilized During Treatment: [facility]) Nurse Communication: Mobility status  Activity Tolerance: Patient tolerated treatment well Patient left: in bed;with call bell/phone within reach;with bed alarm set  OT Visit Diagnosis: Unsteadiness on feet (R26.81);Cognitive communication deficit (R41.841)                Time: 3790-2409 OT Time Calculation (min): 24 min Charges:  OT General Charges $OT Visit: 1 Visit OT Evaluation $OT Eval Low Complexity: 1 Low OT Treatments $Self Care/Home Management : 8-22 mins  Lucille Passy, OTR/L [place] Pager [phone] Office [phone]   Lucille Passy M 07/23/2019, 10:47 AM

## 2019-07-23 NOTE — ED Notes (Signed)
ED TO INPATIENT HANDOFF REPORT  ED Nurse Name and Phone #: Aadvik Roker [phone]  S Name/Age/Gender Deborah Newton 78 y.o. female Room/Bed: 044C/044C  Code Status   Code Status: Partial Code  Home/SNF/Other Home Patient oriented to: self and situation Is this baseline? Yes   Triage Complete: Triage complete  Chief Complaint hypotensive  Triage Note Pt presents from home, was resting in a chair at kitchen table when famnily noticed pt tilt to the side and fall out of chair, hitting her head. Unresponsive per family after this event. Per EMS, Alert and oriented x2 (person, month).  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 Family was poor historians of baseline cognition and deficits, said the pt had been feeling poorly for a few days.  EMS exam - palp SBP 70 upon arrival, improved to 80/50 en route. CBG 122, 94% RA   Allergies Not on File  Level of Care/Admitting Diagnosis ED Disposition    ED Disposition Condition Comment   Admit  Hospital Area: [facility] [[id number]]  Level of Care: Telemetry Cardiac [103]  Covid Evaluation: Asymptomatic Screening Protocol (No Symptoms)  Diagnosis: Syncope and collapse [780.2.ICD-9-CM]  Admitting Physician: Tyson Alias [[id number]]  Attending Physician: Tyson Alias [[id number]]  Estimated length of stay: past midnight tomorrow  Certification:: I certify this patient will need inpatient services for at least 2 midnights  PT Class (Do Not Modify): Inpatient [101]  PT Acc Code (Do Not Modify): Private [1]       B Medical/Surgery History No past medical history on file.    A IV Location/Drains/Wounds Patient Lines/Drains/Airways Status   Active Line/Drains/Airways    Name:   Placement date:   Placement time:   Site:   Days:   Peripheral IV 07/22/19 Right Antecubital   07/22/19    1847    Antecubital   1          Intake/Output Last 24 hours  Intake/Output Summary (Last 24 hours)  at 07/23/2019 0046 Last data filed at 07/22/2019 2205 Gross per 24 hour  Intake 1000 ml  Output -  Net 1000 ml    Labs/Imaging Results for orders placed or performed during the hospital encounter of 07/22/19 (from the past 48 hour(s))  CBG monitoring, ED     Status: Abnormal   Collection Time: 07/22/19  6:45 PM  Result Value Ref Range   Glucose-Capillary 116 (H) 70 - 99 mg/dL  Comprehensive metabolic panel     Status: Abnormal   Collection Time: 07/22/19  7:01 PM  Result Value Ref Range   Sodium 144 135 - 145 mmol/L   Potassium 3.8 3.5 - 5.1 mmol/L   Chloride 103 98 - 111 mmol/L   CO2 27 22 - 32 mmol/L   Glucose, Bld 115 (H) 70 - 99 mg/dL   BUN 32 (H) 8 - 23 mg/dL   Creatinine, Ser 1.91 (H) 0.44 - 1.00 mg/dL   Calcium 9.4 8.9 - 47.8 mg/dL   Total Protein 6.6 6.5 - 8.1 g/dL   Albumin 3.6 3.5 - 5.0 g/dL   AST 16 15 - 41 U/L   ALT 11 0 - 44 U/L   Alkaline Phosphatase 89 38 - 126 U/L   Total Bilirubin 0.4 0.3 - 1.2 mg/dL   GFR calc non Af Amer 21 (L) >60 mL/min   GFR calc Af Amer 24 (L) >60 mL/min   Anion gap 14 5 - 15    Comment: Performed at [facility], [address] [place], [place] [address]  Lipase, blood     Status: Abnormal   Collection Time: 07/22/19  7:01 PM  Result Value Ref Range   Lipase 57 (H) 11 - 51 U/L    Comment: Performed at [facility], [address] [place], [place] [address]  Brain natriuretic peptide     Status: Abnormal   Collection Time: 07/22/19  7:01 PM  Result Value Ref Range   B Natriuretic Peptide 142.9 (H) 0.0 - 100.0 pg/mL    Comment: Performed at [facility], [address] [place], [place] [address]  Troponin I (High Sensitivity)     Status: Abnormal   Collection Time: 07/22/19  7:01 PM  Result Value Ref Range   Troponin I (High Sensitivity) 21 (H) <18 ng/L    Comment: (NOTE) Elevated high sensitivity troponin I (hsTnI) values and significant  changes across serial measurements may suggest ACS but many other   chronic and acute conditions are known to elevate hsTnI results.  Refer to the "Links" section for chest pain algorithms and additional  guidance. Performed at [facility], [address] [place], [place] [address]   CBC with Differential     Status: Abnormal   Collection Time: 07/22/19  7:01 PM  Result Value Ref Range   WBC 5.8 4.0 - 10.5 K/uL   RBC 3.67 (L) 3.87 - 5.11 MIL/uL   Hemoglobin 10.3 (L) 12.0 - 15.0 g/dL   HCT 40.132.2 (L) 02.736.0 - 25.346.0 %   MCV 87.7 80.0 - 100.0 fL   MCH 28.1 26.0 - 34.0 pg   MCHC 32.0 30.0 - 36.0 g/dL   RDW 66.414.2 40.311.5 - 47.415.5 %   Platelets 243 150 - 400 K/uL   nRBC 0.0 0.0 - 0.2 %   Neutrophils Relative % 42 %   Neutro Abs 2.4 1.7 - 7.7 K/uL   Lymphocytes Relative 46 %   Lymphs Abs 2.7 0.7 - 4.0 K/uL   Monocytes Relative 9 %   Monocytes Absolute 0.5 0.1 - 1.0 K/uL   Eosinophils Relative 2 %   Eosinophils Absolute 0.1 0.0 - 0.5 K/uL   Basophils Relative 1 %   Basophils Absolute 0.1 0.0 - 0.1 K/uL   Immature Granulocytes 0 %   Abs Immature Granulocytes 0.01 0.00 - 0.07 K/uL    Comment: Performed at [facility], [address] [place], [place] [address]  Protime-INR     Status: Abnormal   Collection Time: 07/22/19  7:01 PM  Result Value Ref Range   Prothrombin Time 15.9 (H) 11.4 - 15.2 seconds   INR 1.3 (H) 0.8 - 1.2    Comment: (NOTE) INR goal varies based on device and disease states. Performed at [facility], [address] [place], [place] [address]   Magnesium     Status: None   Collection Time: 07/22/19  7:01 PM  Result Value Ref Range   Magnesium 1.8 1.7 - 2.4 mg/dL    Comment: Performed at [facility], [address] [place], [place] [address]  Phosphorus     Status: None   Collection Time: 07/22/19  7:01 PM  Result Value Ref Range   Phosphorus 3.6 2.5 - 4.6 mg/dL    Comment: Performed at [facility], [address] [place], [place] [address]  TSH     Status: None   Collection Time: 07/22/19   7:02 PM  Result  Value Ref Range   TSH 0.534 0.350 - 4.500 uIU/mL    Comment: Performed by a 3rd Generation assay with a functional sensitivity of <=0.01 uIU/mL. Performed at [facility], [place] [address] [place], [place] [address]   POC occult blood, ED     Status: None   Collection Time: 07/22/19  7:07 PM  Result Value Ref Range   Fecal Occult Bld NEGATIVE NEGATIVE  Lactic acid, plasma     Status: Abnormal   Collection Time: 07/22/19  8:00 PM  Result Value Ref Range   Lactic Acid, Venous 2.1 (HH) 0.5 - 1.9 mmol/L    Comment: CRITICAL RESULT CALLED TO, READ BACK BY AND VERIFIED WITH: RN B HUNYCATT @2043  07/22/19 BY S GEZAHEGN Performed at [facility], [place] [address], [place], [place] [address]   Type and screen [place]     Status: None   Collection Time: 07/22/19  8:00 PM  Result Value Ref Range   ABO/RH(D) AB POS    Antibody Screen NEG    Sample Expiration      07/25/2019,2359 Performed at [facility], [place] [address], [place], [place] [address]   ABO/Rh     Status: None   Collection Time: 07/22/19  8:00 PM  Result Value Ref Range   ABO/RH(D)      AB POS Performed at [place] [address], [place], [place] [address]   Lactic acid, plasma     Status: None   Collection Time: 07/22/19  9:01 PM  Result Value Ref Range   Lactic Acid, Venous 1.6 0.5 - 1.9 mmol/L    Comment: Performed at [place] [address], [place], [place] [address]  Troponin I (High Sensitivity)     Status: Abnormal   Collection Time: 07/22/19  9:15 PM  Result Value Ref Range   Troponin I (High Sensitivity) 22 (H) <18 ng/L    Comment: (NOTE) Elevated high sensitivity troponin I (hsTnI) values and significant  changes across serial measurements may suggest ACS but many other  chronic and acute conditions are known to elevate hsTnI results.  Refer to the "Links" section for chest pain algorithms and additional  guidance. Performed at [facility], [place] [address] [place], [place] [address]    Ct Head Wo Contrast  Result Date: 07/22/2019 CLINICAL DATA:  Pain after fall EXAM: CT HEAD WITHOUT CONTRAST CT CERVICAL SPINE WITHOUT CONTRAST TECHNIQUE: Multidetector CT imaging of the head and cervical spine was performed following the standard protocol without intravenous contrast. Multiplanar CT image reconstructions of the cervical spine were also generated. COMPARISON:  None. FINDINGS: CT HEAD FINDINGS Brain: Infarcts with associated encephalomalacia are seen in the posterior right frontal lobe, the right parietal lobe, right temporal lobe, the left cerebellar hemisphere, the left occipital lobe, and the left posterior parietal lobe. No acute ischemia or infarct is identified. No subdural, epidural, or subarachnoid hemorrhage identified. Basal cisterns are patent. Ventricles are unremarkable. No mass effect or midline shift. Vascular: Calcified atherosclerosis is identified in the intracranial carotids. Skull: Normal. Negative for fracture or focal lesion. Sinuses/Orbits: Paranasal sinuses and left mastoid air cells are normal. Middle ears are normal. Fluid in inferior right mastoid air cells without erosion or overlying soft tissue swelling is identified. Other: None. CT CERVICAL SPINE FINDINGS Alignment: Normal. Skull base and vertebrae: No acute fracture. No primary bone lesion or focal pathologic process. Soft tissues and spinal canal: Calcified  atherosclerosis in the carotid arteries. Suspected right thyroid in lobe nodule measuring up to 2 cm on axial image 9. Atherosclerosis in the aortic arch. Disc levels:  Multilevel degenerative changes. Upper chest: Negative. Other: No other abnormalities. IMPRESSION: 1. No acute intracranial abnormalities. Multiple infarcts are chronic in appearance with encephalomalacia. 2. No fracture or traumatic malalignment in the cervical spine. Significant degenerative changes. 3. 2 cm nodule in the right  thyroid lobe. If the patient has significant comorbidities or limited life expectancy, no follow-up is necessary. Otherwise, recommend ultrasound. Electronically Signed   By: Gerome Sam III M.D   On: 07/22/2019 20:07   Ct Cervical Spine Wo Contrast  Result Date: 07/22/2019 CLINICAL DATA:  Pain after fall EXAM: CT HEAD WITHOUT CONTRAST CT CERVICAL SPINE WITHOUT CONTRAST TECHNIQUE: Multidetector CT imaging of the head and cervical spine was performed following the standard protocol without intravenous contrast. Multiplanar CT image reconstructions of the cervical spine were also generated. COMPARISON:  None. FINDINGS: CT HEAD FINDINGS Brain: Infarcts with associated encephalomalacia are seen in the posterior right frontal lobe, the right parietal lobe, right temporal lobe, the left cerebellar hemisphere, the left occipital lobe, and the left posterior parietal lobe. No acute ischemia or infarct is identified. No subdural, epidural, or subarachnoid hemorrhage identified. Basal cisterns are patent. Ventricles are unremarkable. No mass effect or midline shift. Vascular: Calcified atherosclerosis is identified in the intracranial carotids. Skull: Normal. Negative for fracture or focal lesion. Sinuses/Orbits: Paranasal sinuses and left mastoid air cells are normal. Middle ears are normal. Fluid in inferior right mastoid air cells without erosion or overlying soft tissue swelling is identified. Other: None. CT CERVICAL SPINE FINDINGS Alignment: Normal. Skull base and vertebrae: No acute fracture. No primary bone lesion or focal pathologic process. Soft tissues and spinal canal: Calcified atherosclerosis in the carotid arteries. Suspected right thyroid in lobe nodule measuring up to 2 cm on axial image 9. Atherosclerosis in the aortic arch. Disc levels:  Multilevel degenerative changes. Upper chest: Negative. Other: No other abnormalities. IMPRESSION: 1. No acute intracranial abnormalities. Multiple infarcts are  chronic in appearance with encephalomalacia. 2. No fracture or traumatic malalignment in the cervical spine. Significant degenerative changes. 3. 2 cm nodule in the right thyroid lobe. If the patient has significant comorbidities or limited life expectancy, no follow-up is necessary. Otherwise, recommend ultrasound. Electronically Signed   By: Gerome Sam III M.D   On: 07/22/2019 20:07   Dg Chest Port 1 View  Result Date: 07/22/2019 CLINICAL DATA:  Syncope EXAM: PORTABLE CHEST 1 VIEW COMPARISON:  None. FINDINGS: Streaky right basilar and retrocardiac opacity. No focal consolidation. No pneumothorax or effusion. Mild cardiomegaly with a calcified aorta. Increased attenuation in the lower mediastinum could reflect a small hiatal hernia. Degenerative changes are present in the imaged spine and shoulders. No acute osseous or soft tissue abnormality. IMPRESSION: 1. Streaky right basilar and retrocardiac opacity, likely atelectasis. 2. Increased attenuation in the lower mediastinum could reflect a small hiatal hernia. 3. Mild cardiomegaly 4.  Aortic Atherosclerosis (ICD10-I70.0). Electronically Signed   By: Kreg Shropshire M.D.   On: 07/22/2019 19:34    Pending Labs Unresulted Labs (From admission, onward)    Start     Ordered   07/23/19 0500  Basic metabolic panel  Tomorrow morning,   R     07/22/19 2304   07/23/19 0500  CBC  Tomorrow morning,   R     07/22/19 2304   07/22/19 1903  SARS CORONAVIRUS 2 (TAT 6-24 HRS)  Nasopharyngeal Nasopharyngeal Swab  (Asymptomatic/Tier 3)  Once,   STAT    Question Answer Comment  Is this test for diagnosis or screening Screening   Symptomatic for COVID-19 as defined by CDC No   Hospitalized for COVID-19 No   Admitted to ICU for COVID-19 No   Previously tested for COVID-19 No   Resident in a congregate (group) care setting No   Employed in healthcare setting No   Pregnant No      07/22/19 1902   07/22/19 1901  Urinalysis, Routine w reflex microscopic  ONCE -  STAT,   STAT     07/22/19 1902   07/22/19 1901  Culture, blood (routine x 2)  BLOOD CULTURE X 2,   STAT     07/22/19 1902          Vitals/Pain Today's Vitals   07/22/19 2300 07/22/19 2345 07/23/19 0000 07/23/19 0015  BP: 113/65 114/75 126/66 123/63  Pulse:      Resp: Temp:      TempSrc:      SpO2:        Isolation Precautions No active isolations  Medications Medications  atorvastatin (LIPITOR) tablet 80 mg (80 mg Oral Given 07/23/19 0030)  sertraline (ZOLOFT) tablet 25 mg (has no administration in time range)  docusate sodium (COLACE) capsule 200 mg (has no administration in time range)  apixaban (ELIQUIS) tablet 5 mg (5 mg Oral Given 07/23/19 0030)  pantoprazole (PROTONIX) EC tablet 40 mg (has no administration in time range)  magnesium oxide (MAG-OX) tablet 400 mg (has no administration in time range)  ferrous sulfate tablet 325 mg (has no administration in time range)  sodium chloride flush (NS) 0.9 % injection 3 mL (has no administration in time range)  acetaminophen (TYLENOL) tablet 650 mg (has no administration in time range)    Or  acetaminophen (TYLENOL) suppository 650 mg (has no administration in time range)  sodium chloride 0.9 % bolus 500 mL (0 mLs Intravenous Stopped 07/22/19 2102)  sodium chloride 0.9 % bolus 500 mL (0 mLs Intravenous Stopped 07/22/19 2205)    Mobility walks with person assist     Focused Assessments Cardiac Assessment Handoff:    No results found for: CKTOTAL, CKMB, CKMBINDEX, TROPONINI No results found for: DDIMER Does the Patient currently have chest pain? No      R Recommendations: See Admitting Provider Note  Report given to:   Additional Notes:

## 2019-07-23 NOTE — Progress Notes (Signed)
  Nurse called Pt's daughter [name], to verify that she will be picking pt up.  She stated that will be here in 16 minutes.  Pt is dressed and ready for when family arrives, she will be taken by nurse tech in wheelchair to [facility].

## 2019-07-23 NOTE — Progress Notes (Signed)
  Pt's daughter called for an update and requested to speak the doctor.  [name] Stann Mainland (pt's daughter number [phone])

## 2019-07-23 NOTE — Progress Notes (Signed)
Echocardiogram 2D Echocardiogram has been performed.  Darlina Sicilian M 07/23/2019, 10:21 AM

## 2019-07-23 NOTE — Progress Notes (Signed)
    07/23/19 0853  Orthostatic Lying   BP- Lying 97/60  Pulse- Lying 50  Orthostatic Sitting  BP- Sitting (!) 81/56  Pulse- Sitting 54  Orthostatic Standing at 0 minutes  BP- Standing at 0 minutes 104/74  Pulse- Standing at 0 minutes 57  Orthostatic Standing at 3 minutes  BP- Standing at 3 minutes 111/74  Pulse- Standing at 3 minutes 63

## 2019-07-23 NOTE — Progress Notes (Signed)
  Pt ambulated 100 ft in the hallway using her cane and at room air. Oxygen saturation while ambulating at 100%. Pt denied shortness of breath, dizziness or weakness.  Pt is currently in her bed, call bell within reach and bed alarm on.  Pt's daughter at bedside.    07/23/19 1251  Vitals  Temp 99.6 F (37.6 C)  Temp Source Oral  BP 104/81  MAP (mmHg) 89  BP Method Automatic  Pulse Rate (!) 51  Pulse Rate Source Monitor  Resp 18  Orthostatic Lying   BP- Lying 104/81  Pulse- Lying 51  Orthostatic Sitting  BP- Sitting 106/73  Pulse- Sitting 58  Orthostatic Standing at 0 minutes  BP- Standing at 0 minutes 119/77  Pulse- Standing at 0 minutes 63  Orthostatic Standing at 3 minutes  BP- Standing at 3 minutes 108/66  Pulse- Standing at 3 minutes 68  Oxygen Therapy  SpO2 100 %  O2 Device Room Air  MEWS Score  MEWS RR 0  MEWS Pulse 0  MEWS Systolic 0  MEWS LOC 0  MEWS Temp 0  MEWS Score 0  MEWS Score Color Green

## 2019-07-23 NOTE — Care Management Obs Status (Signed)
[facility] NOTIFICATION   Patient Details  Name: Ishani Goldwasser MRN: [id number] Date of Birth: [birth year]   Medicare Observation Status Notification Given:  Yes    Carles Collet, RN 07/23/2019, 2:05 PM

## 2019-07-23 NOTE — Progress Notes (Signed)
  Pt's daughter [facility] Stann Mainland) at bedside.

## 2019-07-23 NOTE — Progress Notes (Signed)
Spoke with pt's daughter Trudi Ida Stann Mainland) regarding plan of care; other daughters were in the phone call as well. Questions and concerns were answered.  Ms.Rogers stated that she wants to be the designated contact for pt, and that she will update the family.  Ms.Rogers said that she will be coming to visit patient, hospital visitor policy discussed with pt's daughters and verbalized understanding.

## 2019-07-23 NOTE — Progress Notes (Signed)
   Subjective: Deborah Newton is feeling well and back to her baseline this morning. No further lightheadedness. Denies chest pain or shortness of breath. She states she has had lower blood pressure her whole life and has been hospitalized for similar events in the past. No recent medication changes, and she did not mistakenly take extra medication.   Spoke with daughter, Trudi Ida, over the phone. She states that cigarette that her mother went to smoke just prior to episode was in fact marijunana that had been provided to her by another sister.   Objective:  Vital signs in last 24 hours: Vitals:   07/23/19 0143 07/23/19 0457 07/23/19 0537 07/23/19 0538  BP: 102/60 101/61 109/67 117/84  Pulse: (!) 51 (!) 50 (!) 51 61  Resp: 18 20    Temp: 97.9 F (36.6 C) 98.6 F (37 C)    TempSrc: Oral Oral    SpO2: 100% 100% 100%   Weight:      Height:       General: awake, alert, lying in bed in NAD CV: bradycardic, regular rhythm  Neuro: alert and oriented; answers questions and follows commands appropriately   Assessment/Plan:  Active Problems:   Syncope and collapse  In summary, Ms. [name] is a 78 year old female with past medical history significant for dementia, CVA with residual left-sided weakness, a-fib, HFrEF (EF 40-45% 04/2014), and HTN who presented to the ED after a syncopal event in her family's home.  1. Syncope: likely related to anti-hypertensive regimen, including beta-blocker. Smoking marijuana may have contributed, but she is still persistently bradycardic and hypotensive which indicates the medications are the predominant factor. We will monitor her off of medications, have her work with PT/OT, and re-check orthostatics later today. She can potentially go home later today if she is asymptomatic and heart rate is stable, but may need an additional night to allow the medications to wash out. Echo is also being done as part of work-up.  - will plan to hold Imdur and Hydral at discharge   - decrease Metoprolol dose to 50 mg   2. HFrEF - euvolemic on exam  - holding medications in the setting of above - follow-up echo  3. Elevated creatinine  - unclear what her baseline is since she is from out of town  - reported decreased PO intake; received total of 2L on admission; will hold off on further fluids and continue to monitor    Dispo: Anticipated discharge in approximately 0-1 day(s).   Modena Nunnery D, DO 07/23/2019, 11:05 AM Pager: [phone]

## 2019-07-23 NOTE — Care Management CC44 (Signed)
Condition Code 44 Documentation Completed  Patient Details  Name: Karole Oo MRN: [id number] Date of Birth: [birth year]   Condition Code 44 given:  Yes Patient signature on Condition Code 44 notice:  Yes Documentation of 2 MD's agreement:  Yes Code 44 added to claim:  Yes    Carles Collet, RN 07/23/2019, 2:06 PM

## 2019-07-23 NOTE — Evaluation (Signed)
Physical Therapy Evaluation & Discharge Patient Details Name: Deborah Newton MRN: [id number] DOB: October 14, 1940 Today's Date: 07/23/2019   History of Present Illness  Pt 78 y.o. female admitted after period of unresponsiveness while sitting at the table.   CT of head negative for acute infarct.  Dx: syncopal episode likely due to hypotension - work up ongoing.  PMH includes:  dementia, CVA with residual Lt sided weakness, A-Fib, heart failure.    Clinical Impression  Pt presented supine in bed with HOB elevated, awake and willing to participate in therapy session. Prior to admission, pt reported that she ambulated with use of a cane and required some assistance from family with ADLs. Pt lives with her daughter in a two level home with four steps to enter. At the time of evaluation, pt at mod I to supervision level with mobility with use of a cane to ambulate in hallway. Pt asymptomatic throughout. Plan is to d/c home today with family support. Pt would benefit from continued f/u HHPT services to address her deficits upon d/c.    Follow Up Recommendations Home health PT    Equipment Recommendations  None recommended by PT    Recommendations for Other Services       Precautions / Restrictions Precautions Precautions: Fall Restrictions Weight Bearing Restrictions: No      Mobility  Bed Mobility Overal bed mobility: Modified Independent                Transfers Overall transfer level: Needs assistance Equipment used: Straight cane Transfers: Sit to/from Stand Sit to Stand: Supervision         General transfer comment: supervision for safety; adamant that therapist not assist her  Ambulation/Gait Ambulation/Gait assistance: Supervision Gait Distance (Feet): 40 Feet Assistive device: Straight cane Gait Pattern/deviations: Step-through pattern Gait velocity: WFL   General Gait Details: pt steady overall with use of cane in R UE; no LOB or need for physical  assistance  Stairs            Wheelchair Mobility    Modified Rankin (Stroke Patients Only)       Balance Overall balance assessment: Needs assistance Sitting-balance support: Feet supported Sitting balance-Leahy Scale: Good     Standing balance support: Single extremity supported Standing balance-Leahy Scale: Poor Standing balance comment: maintains UE support                              Pertinent Vitals/Pain Pain Assessment: No/denies pain    Home Living Family/patient expects to be discharged to:: Private residence Living Arrangements: Other relatives Available Help at Discharge: Family;Available 24 hours/day Type of Home: House Home Access: Stairs to enter   [employer] of Steps: 3 Home Layout: One level Home Equipment: Walker - 2 wheels;Cane - single point Additional Comments: The above info is for her daughter's home here in [place].  At her home in [place], she has a 2 story house with stair lift.      Prior Function Level of Independence: Needs assistance   Gait / Transfers Assistance Needed: Pt ambulates with RW or SPC depending on the day/time   ADL's / Homemaking Assistance Needed: daughter reports she assists pt with bathing and dressing         Hand Dominance   Dominant Hand: Right    Extremity/Trunk Assessment   Upper Extremity Assessment Upper Extremity Assessment: Defer to OT evaluation;Generalized weakness    Lower Extremity Assessment Lower Extremity Assessment:  Generalized weakness    Cervical / Trunk Assessment Cervical / Trunk Assessment: Normal  Communication   Communication: No difficulties  Cognition Arousal/Alertness: Awake/alert Behavior During Therapy: WFL for tasks assessed/performed Overall Cognitive Status: History of cognitive impairments - at baseline                                 General Comments: per daughter's report (via phone, pt appears to be at baseline       General  Comments      Exercises     Assessment/Plan    PT Assessment All further PT needs can be met in the next venue of care  PT Problem List Decreased balance;Decreased mobility;Decreased coordination;Decreased safety awareness       PT Treatment Interventions      PT Goals (Current goals can be found in the Care Plan section)  Acute Rehab PT Goals Patient Stated Goal: to go back to [place]  PT Goal Formulation: All assessment and education complete, DC therapy    Frequency     Barriers to discharge        Co-evaluation               AM-PAC PT "6 Clicks" Mobility  Outcome Measure Help needed turning from your back to your side while in a flat bed without using bedrails?: None Help needed moving from lying on your back to sitting on the side of a flat bed without using bedrails?: None Help needed moving to and from a bed to a chair (including a wheelchair)?: None Help needed standing up from a chair using your arms (e.g., wheelchair or bedside chair)?: None Help needed to walk in hospital room?: None Help needed climbing 3-5 steps with a railing? : A Little 6 Click Score: 23    End of Session   Activity Tolerance: Patient tolerated treatment well Patient left: in bed;with call bell/phone within reach Nurse Communication: Mobility status PT Visit Diagnosis: Muscle weakness (generalized) (M62.81)    Time: 0626-9485 PT Time Calculation (min) (ACUTE ONLY): 19 min   Charges:   PT Evaluation $PT Eval Low Complexity: 1 Low          Eduard Clos, PT, DPT  Acute Rehabilitation Services Pager [phone] Office [place] 07/23/2019, 2:40 PM

## 2019-07-24 NOTE — Discharge Summary (Signed)
Name: Magdalen Cabana MRN: [id number] DOB: 18-Oct-1940 78 y.o. PCP: No primary care provider on file.  Date of Admission: 07/22/2019  6:34 PM Date of Discharge: 07/23/2019 Attending Physician: Axel Filler, MD  Discharge Diagnosis: 1. Syncope  2. Hypotension 3. Chronic HFrEF    Discharge Medications: Allergies as of 07/23/2019   No Known Allergies     Medication List    STOP taking these medications   hydrALAZINE 50 MG tablet Commonly known as: APRESOLINE   isosorbide mononitrate 30 MG 24 hr tablet Commonly known as: IMDUR     TAKE these medications   acetaminophen 500 MG tablet Commonly known as: TYLENOL Take 500 mg by mouth daily.   Aspirin Adult Low Strength 81 MG EC tablet Generic drug: aspirin Take 81 mg by mouth daily.   atorvastatin 80 MG tablet Commonly known as: LIPITOR Take 80 mg by mouth at bedtime.   bumetanide 2 MG tablet Commonly known as: BUMEX Take 2 mg by mouth daily.   docusate sodium 100 MG capsule Commonly known as: COLACE Take 200 mg by mouth daily as needed for mild constipation.   Eliquis 5 MG Tabs tablet Generic drug: apixaban Take 5 mg by mouth 2 (two) times daily.   ferrous sulfate 325 (65 FE) MG EC tablet Take 325 mg by mouth daily.   lisinopril 10 MG tablet Commonly known as: ZESTRIL Take 10 mg by mouth daily.   magnesium oxide 400 MG tablet Commonly known as: MAG-OX Take 400 mg by mouth daily.   metoprolol succinate 50 MG 24 hr tablet Commonly known as: TOPROL-XL Take 1 tablet (50 mg total) by mouth daily. What changed:   medication strength  how much to take   pantoprazole 40 MG tablet Commonly known as: PROTONIX Take 40 mg by mouth daily.   sertraline 25 MG tablet Commonly known as: ZOLOFT Take 25 mg by mouth daily.       Disposition and follow-up:   Deborah Newton was discharged from [facility] in Good condition.  At the hospital follow up visit please address:   1.  Syncope - related to hypotension in the setting of medications and marijuana use. Hydralazine and Imdur were held. Metoprolol decreased to 50 mg daily. Ensure she has not had further pre-syncopal or syncopal events and may be able to add back medications. Continue to encourage marijuana cessation.   2.  Labs / imaging needed at time of follow-up: BMP   3.  Pending labs/ test needing follow-up: none  Follow-up Appointments:   Hospital Course by problem list: 1. Syncope in the setting of hypotension: Patient presented after witnessed syncopal episode at her daughter's house. She was visiting from [place] so we did not have medical records, but family seems well involved in her care, and she is followed closely by her PCP and cardiologist.  Etiology for syncopal event most likely related to chronic medications with concomitant marijuana use. Patient reports longstanding history of chronic hypotension and bradycardia with fairly frequent light headedness. We recommended decreasing Metoprolol to 50 mg daily, and discontinuing Imdura and Hydralazine for now. She has an appointment with her cardiologist scheduled for 12/4 so will be able to have further discussions on recommended medication regimen. Patient's lightheadedness had resolved the morning following admission, and she felt back at her baseline. She was able to ambulate like she normally does at home without dizziness or weakness. She was discharged home in stable condition with plans to return to [place] to follow-up  closely with her physicians.   2. Chronic HFrEF     Ischemic heart disease Ms. Ostrander's echo showed stable EF of 40-45% compared to last available record in 2015. No significant findings that would have contributed to her syncopal event. She appeared euvolemic on exam without chest pain or shortness of breath. She was continued on her Bumex. Further medications were adjusted as above in the setting of her syncopal  event.     Discharge Vitals:   BP 104/81   Pulse (!) 51   Temp 99.6 F (37.6 C) (Oral)   Resp 18   Ht 5\' 6"  (1.676 m)   Wt 80.9 kg   SpO2 100%   BMI 28.79 kg/m   Pertinent Labs, Studies, and Procedures:   Transthoracic Echo IMPRESSIONS   1. Left ventricular ejection fraction, by visual estimation, is 40 to 45%. The left ventricle has mildly decreased function. There is no left ventricular hypertrophy.  2. Elevated left ventricular end-diastolic pressure.  3. Left ventricular diastolic parameters are consistent with Grade I diastolic dysfunction (impaired relaxation).  4. Mildly dilated left ventricular internal cavity size.  5. The left ventricle demonstrates global hypokinesis.  6. Global right ventricle has normal systolic function.The right ventricular size is normal. No increase in right ventricular wall thickness.  7. Left atrial size was severely dilated.  8. Right atrial size was normal.  9. The mitral valve is normal in structure. Trace mitral valve regurgitation. No evidence of mitral stenosis. 10. The tricuspid valve is normal in structure. Tricuspid valve regurgitation is trivial. 11. The aortic valve is tricuspid. Aortic valve regurgitation is not visualized. No evidence of aortic valve sclerosis or stenosis. 12. The pulmonic valve was normal in structure. Pulmonic valve regurgitation is not visualized. 13. Normal pulmonary artery systolic pressure. 14. The inferior vena cava is normal in size with greater than 50% respiratory variability, suggesting right atrial pressure of 3 mmHg.  Discharge Instructions: Discharge Instructions    Diet - low sodium heart healthy   Complete by: As directed    Discharge instructions   Complete by: As directed    Ms.  Trettin, you were treated in the hospital for syncope which was likely a combination of not eating and drinking well, in addition to your medications giving you too low of a blood pressure and heart rate. We are  recommending that you discontinue the Imdur and Hydralazine and decrease the Metoprolol to 50 mg daily. It is a good you have an appointment with your cardiologist next week and can discuss your medications at that time.   Take care!   Increase activity slowly   Complete by: As directed       Signed: Laural Benes, DO 07/24/2019, 6:58 AM   Pager: [phone]

## 2019-07-27 LAB — CULTURE, BLOOD (ROUTINE X 2): Culture: NO GROWTH

## 2019-07-28 LAB — CULTURE, BLOOD (ROUTINE X 2)
Culture: NO GROWTH
Special Requests: ADEQUATE

## 2019-08-14 ENCOUNTER — Other Ambulatory Visit: Payer: Self-pay | Admitting: Internal Medicine

## 2021-06-19 IMAGING — CT CT HEAD W/O CM
4 series · 16 of 47 positions shown, 18 images · non-contrast
Comparison: None.

CLINICAL DATA: Pain after fall

EXAM:
CT HEAD WITHOUT CONTRAST
CT CERVICAL SPINE WITHOUT CONTRAST
TECHNIQUE: Multidetector CT imaging of the head and cervical spine was
performed following the standard protocol without intravenous
contrast. Multiplanar CT image reconstructions of the cervical spine
were also generated.

[Series 1: head wo · axial · 0.42mm/px · z∈[-87,+38]mm · 7 of 35 slices shown, 9 images]
[im 5/35  brain]
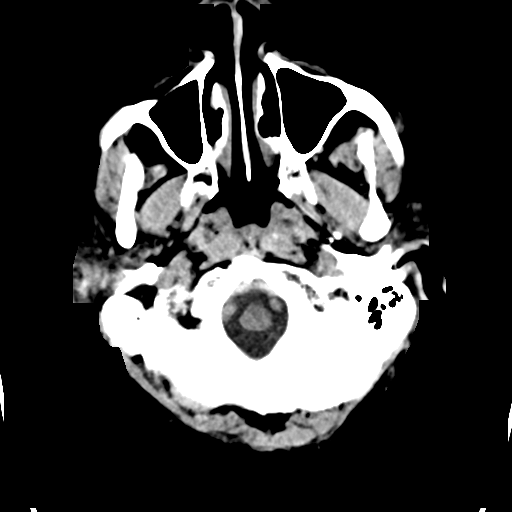
[im 5/35  bone]
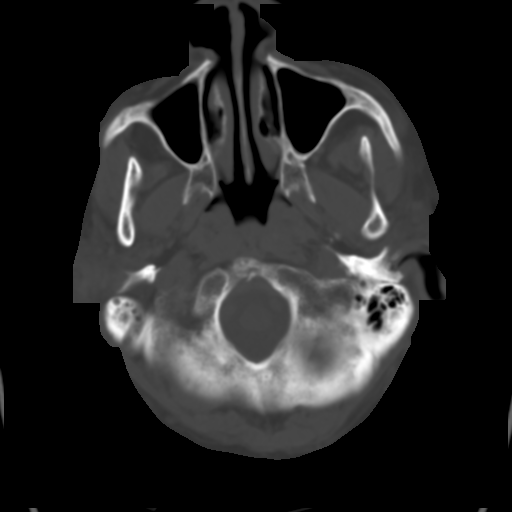
[im 9/35  brain]
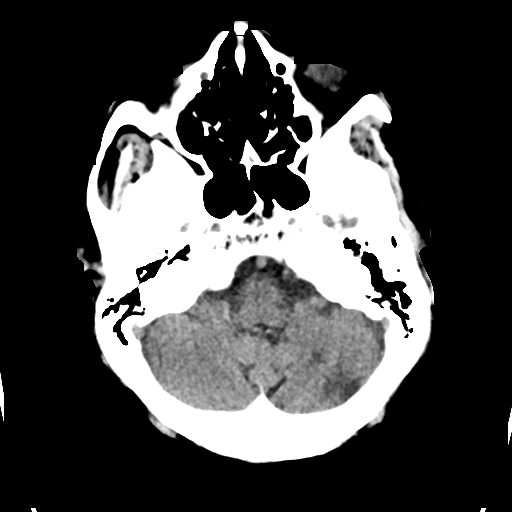
[im 13/35  brain]
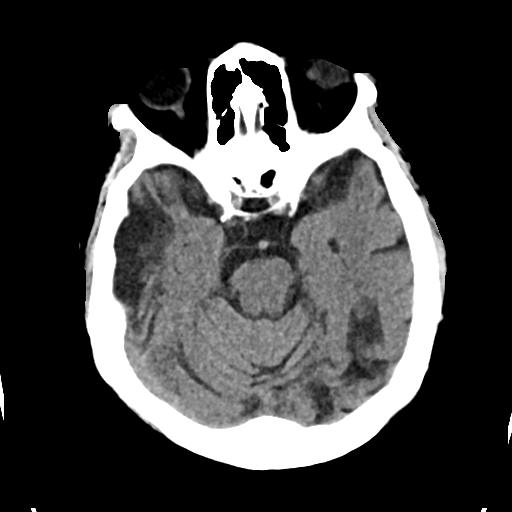
[im 18/35  brain]
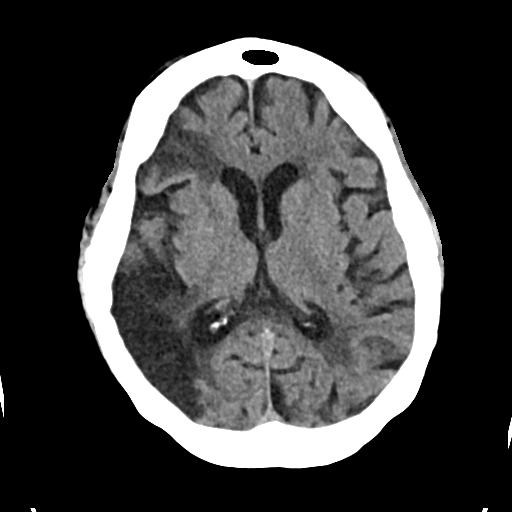
[im 22/35  brain]
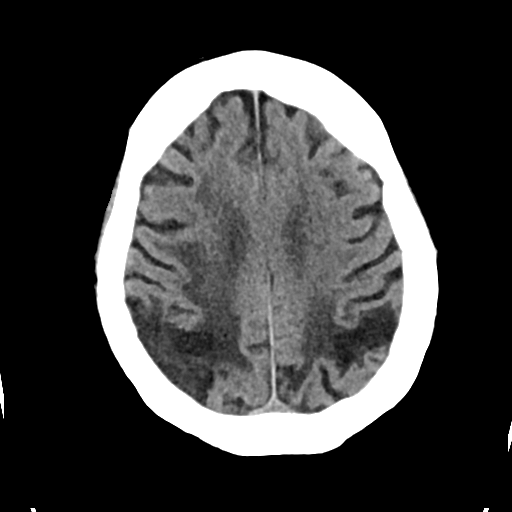
[im 22/35  bone]
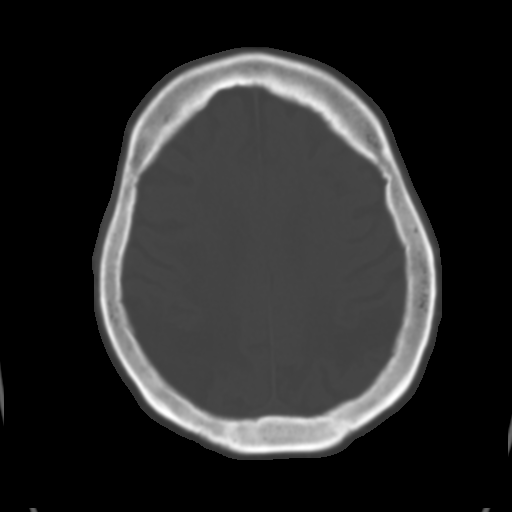
[im 26/35  brain]
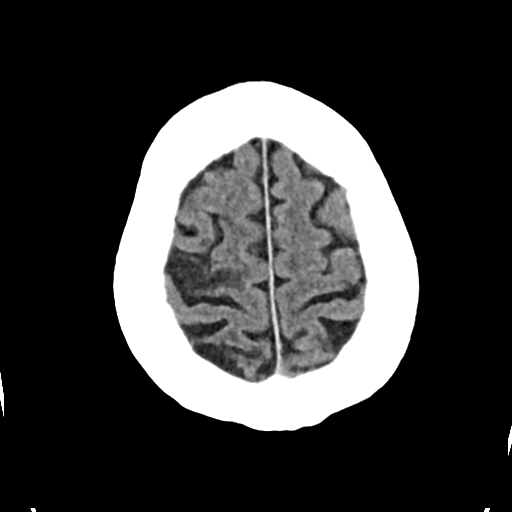
[im 30/35  brain]
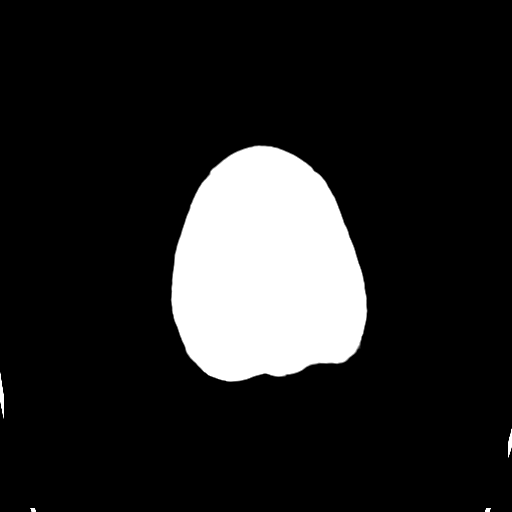

[Series 4: head bone · axial · 0.42mm/px · z∈[-91,-55]mm · 3 of 88 slices shown]
[im 9/88  bone]
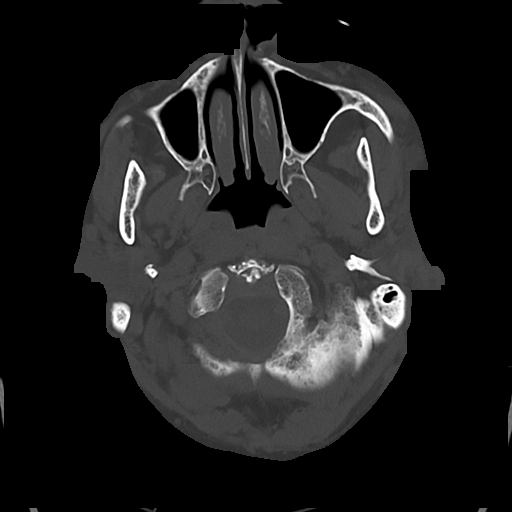
[im 18/88  bone]
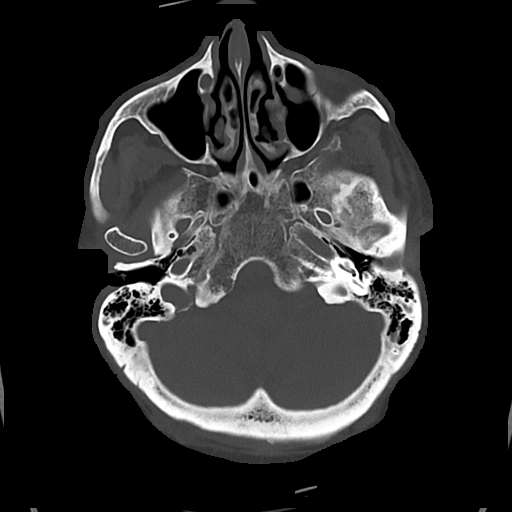
[im 27/88  bone]
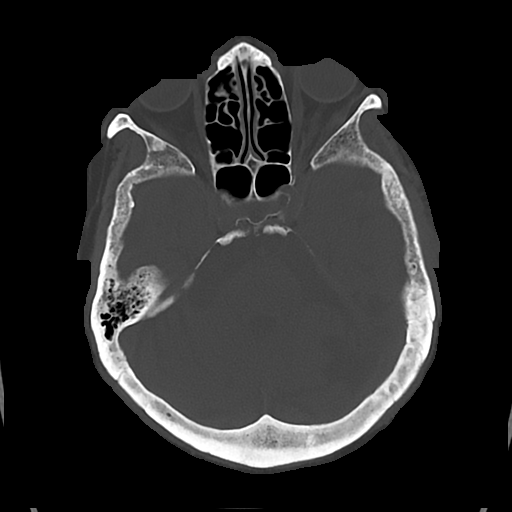

[Series 5: cor soft · coronal · 0.33mm/px · 3 of 71 slices shown]
[im 24/71  brain]
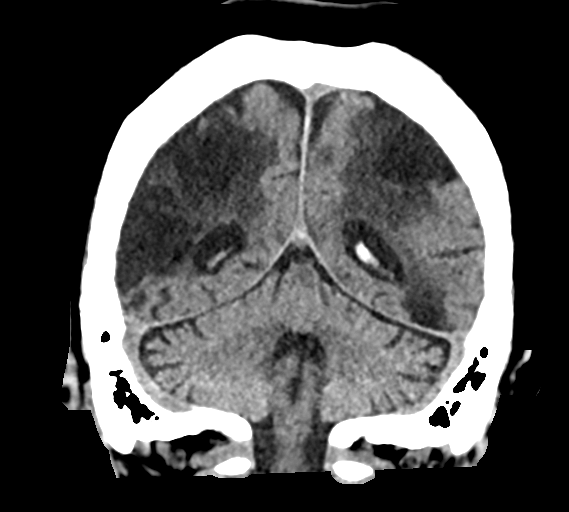
[im 32/71  brain]
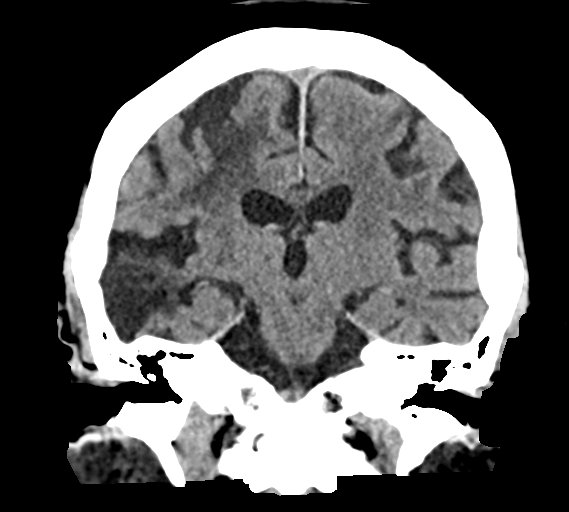
[im 39/71  brain]
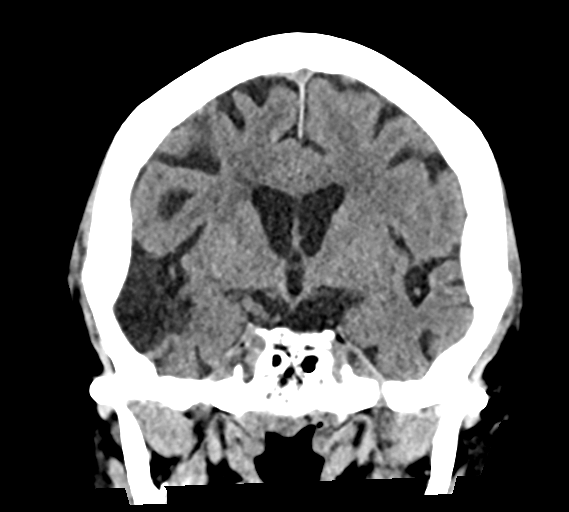

[Series 6: sag soft · sagittal · 0.33mm/px · 3 of 63 slices shown]
[im 21/63  brain]
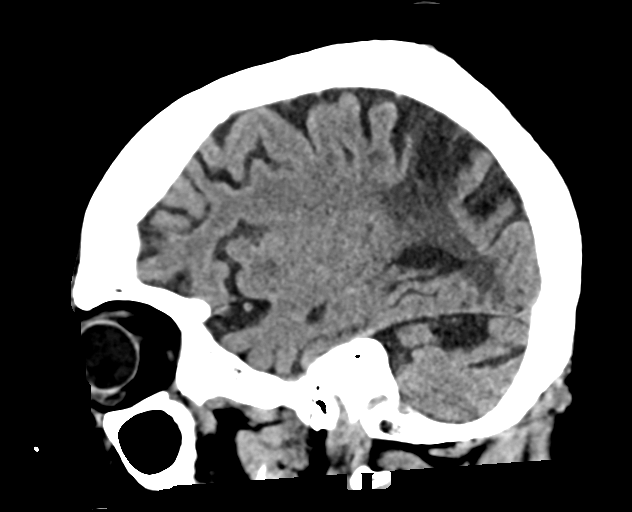
[im 32/63  brain]
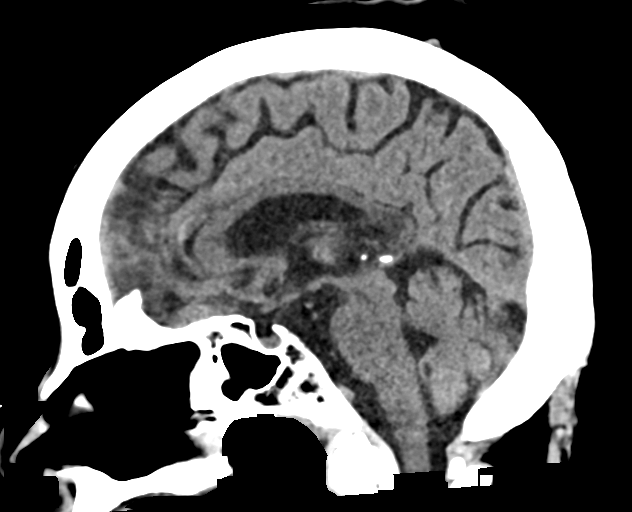
[im 42/63  brain]
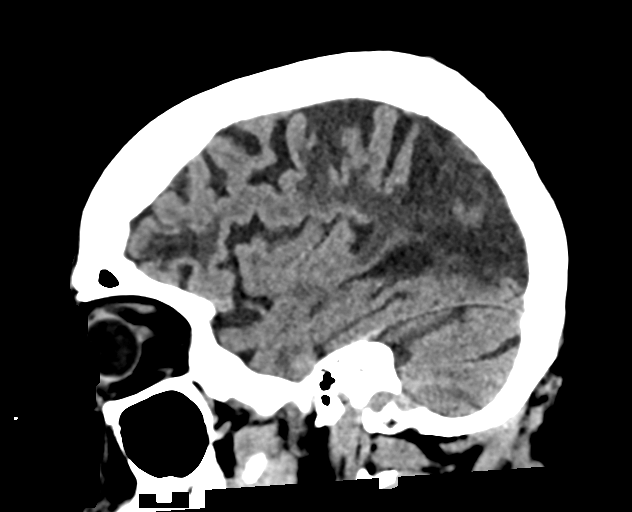

[16 of 47 positions shown; findings below may reference images not displayed]

FINDINGS: CT HEAD FINDINGS

Brain: Infarcts with associated encephalomalacia are seen in the
posterior right frontal lobe, the right parietal lobe, right
temporal lobe, the left cerebellar hemisphere, the left occipital
lobe, and the left posterior parietal lobe. No acute ischemia or
infarct is identified. No subdural, epidural, or subarachnoid
hemorrhage identified. Basal cisterns are patent. Ventricles are
unremarkable. No mass effect or midline shift.

Vascular: Calcified atherosclerosis is identified in the
intracranial carotids.

Skull: Normal. Negative for fracture or focal lesion.

Sinuses/Orbits: Paranasal sinuses and left mastoid air cells are
normal. Middle ears are normal. Fluid in inferior right mastoid air
cells without erosion or overlying soft tissue swelling is
identified.

Other: None.

CT CERVICAL SPINE FINDINGS

Alignment: Normal.

Skull base and vertebrae: No acute fracture. No primary bone lesion
or focal pathologic process.

Soft tissues and spinal canal: Calcified atherosclerosis in the
carotid arteries. Suspected right thyroid in lobe nodule measuring
up to 2 cm on axial image 9. Atherosclerosis in the aortic arch.

Disc levels:  Multilevel degenerative changes.

Upper chest: Negative.

Other: No other abnormalities.
IMPRESSION: 1. No acute intracranial abnormalities. Multiple infarcts are
chronic in appearance with encephalomalacia.
2. No fracture or traumatic malalignment in the cervical spine.
Significant degenerative changes.
3. 2 cm nodule in the right thyroid lobe. If the patient has
significant comorbidities or limited life expectancy, no follow-up
is necessary. Otherwise, recommend ultrasound.
